# Patient Record
Sex: Female | Born: 1995 | Race: White | Hispanic: No | Marital: Single | Smoking: Never smoker
Health system: Southern US, Community
[De-identification: ages and names within clinical notes are randomized; demographics above are authoritative.]

## PROBLEM LIST (undated history)

## (undated) HISTORY — PX: TONSILLECTOMY: SUR1361

## (undated) HISTORY — PX: ADENOIDECTOMY: SUR15

---
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
## 2015-05-07 ENCOUNTER — Encounter (HOSPITAL_COMMUNITY): Payer: Self-pay | Admitting: Emergency Medicine

## 2015-05-07 ENCOUNTER — Emergency Department (HOSPITAL_COMMUNITY)
Admission: EM | Admit: 2015-05-07 | Discharge: 2015-05-07 | Disposition: A | Discharge status: Home / Self Care | Payer: Federal, State, Local not specified - PPO | Attending: Emergency Medicine | Admitting: Emergency Medicine

## 2015-05-07 ENCOUNTER — Emergency Department (HOSPITAL_COMMUNITY): Payer: Federal, State, Local not specified - PPO

## 2015-05-07 DIAGNOSIS — Z880 Allergy status to penicillin: Secondary | ICD-10-CM | POA: Insufficient documentation

## 2015-05-07 DIAGNOSIS — R079 Chest pain, unspecified: Secondary | ICD-10-CM | POA: Diagnosis not present

## 2015-05-07 DIAGNOSIS — Z79899 Other long term (current) drug therapy: Secondary | ICD-10-CM | POA: Diagnosis not present

## 2015-05-07 DIAGNOSIS — J029 Acute pharyngitis, unspecified: Secondary | ICD-10-CM | POA: Diagnosis present

## 2015-05-07 DIAGNOSIS — J069 Acute upper respiratory infection, unspecified: Secondary | ICD-10-CM | POA: Diagnosis not present

## 2015-05-07 LAB — URINALYSIS, ROUTINE W REFLEX MICROSCOPIC
Bilirubin Urine: NEGATIVE
GLUCOSE, UA: NEGATIVE mg/dL
Ketones, ur: 15 mg/dL — AB
LEUKOCYTES UA: NEGATIVE
Nitrite: NEGATIVE
PH: 8.5 — AB (ref 5.0–8.0)
Protein, ur: NEGATIVE mg/dL
SPECIFIC GRAVITY, URINE: 1.018 (ref 1.005–1.030)
Urobilinogen, UA: 0.2 mg/dL (ref 0.0–1.0)

## 2015-05-07 LAB — MONONUCLEOSIS SCREEN: MONO SCREEN: NEGATIVE

## 2015-05-07 LAB — CBC WITH DIFFERENTIAL/PLATELET
BASOS ABS: 0 10*3/uL (ref 0.0–0.1)
Basophils Relative: 0 %
Eosinophils Absolute: 0 10*3/uL (ref 0.0–0.7)
Eosinophils Relative: 0 %
HCT: 42.4 % (ref 36.0–46.0)
HEMOGLOBIN: 15 g/dL (ref 12.0–15.0)
LYMPHS ABS: 1.9 10*3/uL (ref 0.7–4.0)
LYMPHS PCT: 12 %
MCH: 31.6 pg (ref 26.0–34.0)
MCHC: 35.4 g/dL (ref 30.0–36.0)
MCV: 89.5 fL (ref 78.0–100.0)
Monocytes Absolute: 0.9 10*3/uL (ref 0.1–1.0)
Monocytes Relative: 6 %
NEUTROS ABS: 12.3 10*3/uL — AB (ref 1.7–7.7)
NEUTROS PCT: 82 %
Platelets: 215 10*3/uL (ref 150–400)
RBC: 4.74 MIL/uL (ref 3.87–5.11)
RDW: 11.8 % (ref 11.5–15.5)
WBC: 15.1 10*3/uL — AB (ref 4.0–10.5)

## 2015-05-07 LAB — BASIC METABOLIC PANEL
Anion gap: 9 (ref 5–15)
BUN: 11 mg/dL (ref 6–20)
CHLORIDE: 104 mmol/L (ref 101–111)
CO2: 23 mmol/L (ref 22–32)
CREATININE: 0.7 mg/dL (ref 0.44–1.00)
Calcium: 9.6 mg/dL (ref 8.9–10.3)
GFR calc Af Amer: >60 mL/min (ref >60)
GFR calc non Af Amer: >60 mL/min (ref >60)
Glucose, Bld: 109 mg/dL — ABNORMAL HIGH (ref 65–99)
Potassium: 3.5 mmol/L (ref 3.5–5.1)
SODIUM: 136 mmol/L (ref 135–145)

## 2015-05-07 LAB — URINE MICROSCOPIC-ADD ON

## 2015-05-07 LAB — PREGNANCY, URINE: Preg Test, Ur: NEGATIVE

## 2015-05-07 LAB — RAPID STREP SCREEN (MED CTR MEBANE ONLY): Streptococcus, Group A Screen (Direct): NEGATIVE

## 2015-05-07 MED ORDER — KETOROLAC TROMETHAMINE 30 MG/ML IJ SOLN
30.0000 mg | Freq: Once | INTRAMUSCULAR | Status: AC
  Administered 2015-05-07: 30 mg via INTRAVENOUS
  Filled 2015-05-07: qty 1

## 2015-05-07 MED ORDER — HYDROCODONE-HOMATROPINE 5-1.5 MG/5ML PO SYRP: 5.0000 mL | ORAL_SOLUTION | Freq: Four times a day (QID) | ORAL | Status: DC | PRN

## 2015-05-07 MED ORDER — MELOXICAM 15 MG PO TABS: 15.0000 mg | ORAL_TABLET | Freq: Every day | ORAL | Status: DC

## 2015-05-07 MED ORDER — ACETAMINOPHEN 500 MG PO TABS
1000.0000 mg | ORAL_TABLET | Freq: Once | ORAL | Status: AC
  Administered 2015-05-07: 1000 mg via ORAL
  Filled 2015-05-07: qty 2

## 2015-05-07 MED ORDER — SODIUM CHLORIDE 0.9 % IV BOLUS (SEPSIS)
1000.0000 mL | Freq: Once | INTRAVENOUS | Status: AC
  Administered 2015-05-07: 1000 mL via INTRAVENOUS

## 2015-05-07 MED ORDER — DIPHENHYDRAMINE HCL 50 MG/ML IJ SOLN
25.0000 mg | Freq: Once | INTRAMUSCULAR | Status: AC
  Administered 2015-05-07: 25 mg via INTRAVENOUS
  Filled 2015-05-07: qty 1

## 2015-05-07 NOTE — ED Provider Notes (Signed)
CSN: [id number]     Arrival date & time 05/07/15  0025 History   First MD Initiated Contact with Patient 05/07/15 0151     Chief Complaint  Patient presents with  . URI     (Consider location/radiation/quality/duration/timing/severity/associated sxs/prior Treatment) Patient is a 19 y.o. female presenting with URI. The history is provided by the patient. No language interpreter was used.  URI Presenting symptoms: congestion, cough and sore throat   Congestion:    Location:  Nasal   Interferes with sleep: yes     Interferes with eating/drinking: yes   Cough:    Cough characteristics:  Hacking   Sputum characteristics:  Nondescript   Severity:  Moderate   Onset quality:  Sudden   Duration:  1 day   Timing:  Constant   Progression:  Worsening   Chronicity:  New Sore throat:    Severity:  Severe   Onset quality:  Gradual   Duration:  1 day   Timing:  Constant   Progression:  Worsening Severity:  Moderate Onset quality:  Gradual Duration:  1 day Timing:  Constant Progression:  Unchanged Chronicity:  New Relieved by:  Nothing Worsened by:  Nothing tried Ineffective treatments:  OTC medications Associated symptoms: headaches   Risk factors: sick contacts   Risk factors: not elderly, no chronic cardiac disease, no chronic kidney disease, no chronic respiratory disease, no diabetes mellitus, no immunosuppression, no recent illness and no recent travel     History reviewed. No pertinent past medical history. Past Surgical History  Procedure Laterality Date  . Tonsillectomy    . Adenoidectomy     History reviewed. No pertinent family history. Social History  Substance Use Topics  . Smoking status: Never Smoker   . Smokeless tobacco: None  . Alcohol Use: No   OB History    No data available     Review of Systems  HENT: Positive for congestion and sore throat.   Respiratory: Positive for cough and chest tightness.   Cardiovascular: Positive for chest pain.   Neurological: Positive for headaches.  All other systems reviewed and are negative.     Allergies  Penicillins  Home Medications   Prior to Admission medications   Medication Sig Start Date End Date Taking? Authorizing Provider  ibuprofen (ADVIL,MOTRIN) 200 MG tablet Take 200 mg by mouth every 6 (six) hours as needed.   Yes Historical Provider, MD  levonorgestrel (MIRENA) 20 MCG/24HR IUD 1 each by Intrauterine route once.   Yes Historical Provider, MD  Phenyleph-Diphenhyd-DM-APAP [facility] SEVERE COLD & COUGH) PACKET MISC Take 1 each by mouth daily.   Yes Historical Provider, MD  HYDROcodone-acetaminophen (NORCO/VICODIN) 5-325 MG per tablet Take 1 tablet by mouth every 4 (four) hours as needed for moderate pain or severe pain. Patient not taking: Reported on 05/07/2015 08/07/14   Harle Battiest, NP  naproxen (NAPROSYN) 500 MG tablet Take 1 tablet (500 mg total) by mouth 2 (two) times daily. Patient not taking: Reported on 05/07/2015 08/07/14   Harle Battiest, NP   BP 106/64 mmHg  Pulse 109  Temp(Src) 101.4 F (38.6 C) (Oral)  Resp 22  SpO2 97% Physical Exam  Constitutional: She is oriented to person, place, and time. She appears well-developed and well-nourished. No distress.  HENT:  Head: Normocephalic and atraumatic.  Mouth/Throat: Oropharynx is clear and moist.  Eyes: Conjunctivae and EOM are normal. Pupils are equal, round, and reactive to light.  Neck: Normal range of motion.  Cardiovascular: Normal rate and regular rhythm.  Exam reveals no gallop and no friction rub.   No murmur heard. Pulmonary/Chest: Effort normal and breath sounds normal. She has no wheezes. She has no rales. She exhibits no tenderness.  Abdominal: Soft. She exhibits no distension. There is no tenderness. There is no rebound.  Musculoskeletal: Normal range of motion.  Neurological: She is alert and oriented to person, place, and time. Coordination normal.  Speech is goal-oriented. Moves limbs  without ataxia.   Skin: Skin is warm and dry.  Psychiatric: She has a normal mood and affect. Her behavior is normal.  Nursing note and vitals reviewed.   ED Course  Procedures (including critical care time) Labs Review Labs Reviewed  CBC WITH DIFFERENTIAL/PLATELET - Abnormal; Notable for the following:    WBC 15.1 (*)    Neutro Abs 12.3 (*)    All other components within normal limits  URINALYSIS, ROUTINE W REFLEX MICROSCOPIC (NOT AT [facility]) - Abnormal; Notable for the following:    pH 8.5 (*)    Hgb urine dipstick SMALL (*)    Ketones, ur 15 (*)    All other components within normal limits  URINE MICROSCOPIC-ADD ON - Abnormal; Notable for the following:    Bacteria, UA FEW (*)    All other components within normal limits  RAPID STREP SCREEN (NOT AT [facility])  PREGNANCY, URINE  BASIC METABOLIC PANEL  MONONUCLEOSIS SCREEN    Imaging Review Dg Chest 2 View  05/07/2015  CLINICAL DATA:  Sinus pressure, swollen neck glands, chest tightness for 1 day. EXAM: CHEST  2 VIEW COMPARISON:  None. FINDINGS: Cardiomediastinal silhouette is normal. The lungs are clear without pleural effusions or focal consolidations. Trachea projects midline and there is no pneumothorax. Soft tissue planes and included osseous structures are non-suspicious. IMPRESSION: Normal chest. Electronically Signed   By: Awilda Metroourtnay  Bloomer M.D.   On: 05/07/2015 02:28   I have personally reviewed and evaluated these images and lab results as part of my medical decision-making.   EKG Interpretation   Date/Time:  Tuesday May 07 2015 02:37:07 EST Ventricular Rate:  103 PR Interval:  134 QRS Duration: 93 QT Interval:  331 QTC Calculation: 433 R Axis:   67 Text Interpretation:  Sinus tachycardia Nonspecific T abnormalities,  anterior leads No previous tracing Confirmed by POLLINA  MD, CHRISTOPHER  [phone]) on 05/07/2015 2:40:36 AM      MDM   Final diagnoses:  URI (upper respiratory infection)    4:32  AM Patient's imaging and labs unremarkable for acute changes. Vitals stable and patient afebrile. Patient reports improvement of symptoms after IV fluids and toradol. Patient will be discharged with symptomatic treatment.     Emilia BeckKaitlyn Denelda Akerley, PA-C 05/07/15 [phone]  Gilda Creasehristopher J Pollina, MD 05/07/15 [phone]

## 2015-05-07 NOTE — ED Notes (Signed)
Patient requested update from provider. PA notified.

## 2015-05-07 NOTE — ED Notes (Signed)
Bed: WA05 Expected date:  Expected time:  Means of arrival:  Comments: 

## 2015-05-07 NOTE — Discharge Instructions (Signed)
Take hycodan as needed for cough. Take mobic as needed for pain. Refer to attached documents for more information.

## 2015-05-07 NOTE — ED Notes (Signed)
PA at bedside.

## 2015-05-07 NOTE — ED Notes (Signed)
Pt states she has sinus pressure, fever, swollen glands in her neck, and this evening developed chest tightness  Pt states her sxs started yesterday  Pt states she took ibuprofen 800mg  about 2 hrs ago and theraflu about 3 hrs ago

## 2015-05-09 LAB — CULTURE, GROUP A STREP: Strep A Culture: NEGATIVE

## 2016-03-05 ENCOUNTER — Encounter: Payer: Self-pay | Admitting: Women's Health

## 2016-03-05 ENCOUNTER — Ambulatory Visit (INDEPENDENT_AMBULATORY_CARE_PROVIDER_SITE_OTHER): Payer: Federal, State, Local not specified - PPO | Admitting: Women's Health

## 2016-03-05 VITALS — BP 117/79 | Ht 60.0 in | Wt 229.2 lb

## 2016-03-05 DIAGNOSIS — Z01419 Encounter for gynecological examination (general) (routine) without abnormal findings: Secondary | ICD-10-CM | POA: Diagnosis not present

## 2016-03-05 DIAGNOSIS — N926 Irregular menstruation, unspecified: Secondary | ICD-10-CM

## 2016-03-05 NOTE — Progress Notes (Signed)
Michelle Roy 01-31-1996 [id number]   History:    Presents for annual exam.  Rare bleeding Mirena IUD placed December 2015 in [place]. First partner both. Irregular cycles in the past. Received gardasil. Reports paternal aunt died of cervical cancer. Reports recent normal CBC, TSH at primary care.  Past medical history, past surgical history, family history and social history were all reviewed and documented in the EPIC chart. 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 Went to [facility] [employer]. 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 Parents healthy.  ROS:  A ROS was performed and pertinent positives and negatives are included.  Exam:  Vitals:   03/05/16 1427  BP: 117/79  Weight: 229 lb 3.2 oz (104 kg)  Height: 5' (1.524 m)   Body mass index is 44.76 kg/m.   General appearance:  Normal Thyroid:  Symmetrical, normal in size, without palpable masses or nodularity. Respiratory  Auscultation:  Clear without wheezing or rhonchi Cardiovascular  Auscultation:  Regular rate, without rubs, murmurs or gallops  Edema/varicosities:  Not grossly evident Abdominal  Soft,nontender, without masses, guarding or rebound.  Liver/spleen:  No organomegaly noted  Hernia:  None appreciated  Skin  Inspection:  Grossly normal   Breasts: Examined lying and sitting.     Right: Without masses, retractions, discharge or axillary adenopathy.     Left: Without masses, retractions, discharge or axillary adenopathy. Gentitourinary   Inguinal/mons:  Normal without inguinal adenopathy  External genitalia:  Normal  BUS/Urethra/Skene's glands:  Normal  Vagina:  Normal  Cervix:  Normal IUD strings visible  Uterus:   normal in size, shape and contour.  Midline and mobile  Adnexa/parametria:     Rt: Without masses or tenderness.   Lt: Without masses or tenderness.  Anus and perineum: Normal    Assessment/Plan:  20 y.o. S [place] G0 for annual exam with complaint of weight gain.  05/2014 Mirena IUD placed in  [place] Obesity  Plan: Aware Mirena IUD is good for 5 years. Schedule ultrasound. SBE's, exercise, calcium rich diet, MVI daily encouraged. Reviewed importance of increasing exercise and decreasing calories especially carbs for weight loss. Campus safety reviewed.  Prolactin, UA, Pap.   Harrington ChallengerYOUNG,NANCY J [facility] CenterWHNP, 4:43 PM 03/05/2016

## 2016-03-05 NOTE — Patient Instructions (Signed)
Health Maintenance, Female Adopting a healthy lifestyle and getting preventive care can go a long way to promote health and wellness. Talk with your health care provider about what schedule of regular examinations is right for you. This is a good chance for you to check in with your provider about disease prevention and staying healthy. In between checkups, there are plenty of things you can do on your own. Experts have done a lot of research about which lifestyle changes and preventive measures are most likely to keep you healthy. Ask your health care provider for more information. WEIGHT AND DIET  Eat a healthy diet  Be sure to include plenty of vegetables, fruits, low-fat dairy products, and lean protein.  Do not eat a lot of foods high in solid fats, added sugars, or salt.  Get regular exercise. This is one of the most important things you can do for your health.  Most adults should exercise for at least 150 minutes each week. The exercise should increase your heart rate and make you sweat (moderate-intensity exercise).  Most adults should also do strengthening exercises at least twice a week. This is in addition to the moderate-intensity exercise.  Maintain a healthy weight  Body mass index (BMI) is a measurement that can be used to identify possible weight problems. It estimates body fat based on height and weight. Your health care provider can help determine your BMI and help you achieve or maintain a healthy weight.  For females 20 years of age and older:   A BMI below 18.5 is considered underweight.  A BMI of 18.5 to 24.9 is normal.  A BMI of 25 to 29.9 is considered overweight.  A BMI of 30 and above is considered obese.  Watch levels of cholesterol and blood lipids  You should start having your blood tested for lipids and cholesterol at 20 years of age, then have this test every 5 years.  You may need to have your cholesterol levels checked more often if:  Your lipid  or cholesterol levels are high.  You are older than 20 years of age.  You are at high risk for heart disease.  CANCER SCREENING   Lung Cancer  Lung cancer screening is recommended for adults 55-80 years old who are at high risk for lung cancer because of a history of smoking.  A yearly low-dose CT scan of the lungs is recommended for people who:  Currently smoke.  Have quit within the past 15 years.  Have at least a 30-pack-year history of smoking. A pack year is smoking an average of one pack of cigarettes a day for 1 year.  Yearly screening should continue until it has been 15 years since you quit.  Yearly screening should stop if you develop a health problem that would prevent you from having lung cancer treatment.  Breast Cancer  Practice breast self-awareness. This means understanding how your breasts normally appear and feel.  It also means doing regular breast self-exams. Let your health care provider know about any changes, no matter how small.  If you are in your 20s or 30s, you should have a clinical breast exam (CBE) by a health care provider every 1-3 years as part of a regular health exam.  If you are 40 or older, have a CBE every year. Also consider having a breast X-ray (mammogram) every year.  If you have a family history of breast cancer, talk to your health care provider about genetic screening.  If you   are at high risk for breast cancer, talk to your health care provider about having an MRI and a mammogram every year.  Breast cancer gene (BRCA) assessment is recommended for women who have family members with BRCA-related cancers. BRCA-related cancers include:  Breast.  Ovarian.  Tubal.  Peritoneal cancers.  Results of the assessment will determine the need for genetic counseling and BRCA1 and BRCA2 testing. Cervical Cancer Your health care provider may recommend that you be screened regularly for cancer of the pelvic organs (ovaries, uterus, and  vagina). This screening involves a pelvic examination, including checking for microscopic changes to the surface of your cervix (Pap test). You may be encouraged to have this screening done every 3 years, beginning at age 21.  For women ages 30-65, health care providers may recommend pelvic exams and Pap testing every 3 years, or they may recommend the Pap and pelvic exam, combined with testing for human papilloma virus (HPV), every 5 years. Some types of HPV increase your risk of cervical cancer. Testing for HPV may also be done on women of any age with unclear Pap test results.  Other health care providers may not recommend any screening for nonpregnant women who are considered low risk for pelvic cancer and who do not have symptoms. Ask your health care provider if a screening pelvic exam is right for you.  If you have had past treatment for cervical cancer or a condition that could lead to cancer, you need Pap tests and screening for cancer for at least 20 years after your treatment. If Pap tests have been discontinued, your risk factors (such as having a new sexual partner) need to be reassessed to determine if screening should resume. Some women have medical problems that increase the chance of getting cervical cancer. In these cases, your health care provider may recommend more frequent screening and Pap tests. Colorectal Cancer  This type of cancer can be detected and often prevented.  Routine colorectal cancer screening usually begins at 20 years of age and continues through 20 years of age.  Your health care provider may recommend screening at an earlier age if you have risk factors for colon cancer.  Your health care provider may also recommend using home test kits to check for hidden blood in the stool.  A small camera at the end of a tube can be used to examine your colon directly (sigmoidoscopy or colonoscopy). This is done to check for the earliest forms of colorectal  cancer.  Routine screening usually begins at age 50.  Direct examination of the colon should be repeated every 5-10 years through 20 years of age. However, you may need to be screened more often if early forms of precancerous polyps or small growths are found. Skin Cancer  Check your skin from head to toe regularly.  Tell your health care provider about any new moles or changes in moles, especially if there is a change in a mole's shape or color.  Also tell your health care provider if you have a mole that is larger than the size of a pencil eraser.  Always use sunscreen. Apply sunscreen liberally and repeatedly throughout the day.  Protect yourself by wearing long sleeves, pants, a wide-brimmed hat, and sunglasses whenever you are outside. HEART DISEASE, DIABETES, AND HIGH BLOOD PRESSURE   High blood pressure causes heart disease and increases the risk of stroke. High blood pressure is more likely to develop in:  People who have blood pressure in the high end   of the normal range (130-139/85-89 mm Hg).  People who are overweight or obese.  People who are African American.  If you are 38-23 years of age, have your blood pressure checked every 3-5 years. If you are 61 years of age or older, have your blood pressure checked every year. You should have your blood pressure measured twice--once when you are at a hospital or clinic, and once when you are not at a hospital or clinic. Record the average of the two measurements. To check your blood pressure when you are not at a hospital or clinic, you can use:  An automated blood pressure machine at a pharmacy.  A home blood pressure monitor.  If you are between 45 years and 39 years old, ask your health care provider if you should take aspirin to prevent strokes.  Have regular diabetes screenings. This involves taking a blood sample to check your fasting blood sugar level.  If you are at a normal weight and have a low risk for diabetes,  have this test once every three years after 20 years of age.  If you are overweight and have a high risk for diabetes, consider being tested at a younger age or more often. PREVENTING INFECTION  Hepatitis B  If you have a higher risk for hepatitis B, you should be screened for this virus. You are considered at high risk for hepatitis B if:  You were born in a country where hepatitis B is common. Ask your health care provider which countries are considered high risk.  Your parents were born in a high-risk country, and you have not been immunized against hepatitis B (hepatitis B vaccine).  You have HIV or AIDS.  You use needles to inject street drugs.  You live with someone who has hepatitis B.  You have had sex with someone who has hepatitis B.  You get hemodialysis treatment.  You take certain medicines for conditions, including cancer, organ transplantation, and autoimmune conditions. Hepatitis C  Blood testing is recommended for:  Everyone born from [birth year] through 1965.  Anyone with known risk factors for hepatitis C. Sexually transmitted infections (STIs)  You should be screened for sexually transmitted infections (STIs) including gonorrhea and chlamydia if:  You are sexually active and are younger than 20 years of age.  You are older than 20 years of age and your health care provider tells you that you are at risk for this type of infection.  Your sexual activity has changed since you were last screened and you are at an increased risk for chlamydia or gonorrhea. Ask your health care provider if you are at risk.  If you do not have HIV, but are at risk, it may be recommended that you take a prescription medicine daily to prevent HIV infection. This is called pre-exposure prophylaxis (PrEP). You are considered at risk if:  You are sexually active and do not regularly use condoms or know the HIV status of your partner(s).  You take drugs by injection.  You are sexually  active with a partner who has HIV. Talk with your health care provider about whether you are at high risk of being infected with HIV. If you choose to begin PrEP, you should first be tested for HIV. You should then be tested every 3 months for as long as you are taking PrEP.  PREGNANCY   If you are premenopausal and you may become pregnant, ask your health care provider about preconception counseling.  If you may  become pregnant, take 400 to 800 micrograms (mcg) of folic acid every day.  If you want to prevent pregnancy, talk to your health care provider about birth control (contraception). OSTEOPOROSIS AND MENOPAUSE   Osteoporosis is a disease in which the bones lose minerals and strength with aging. This can result in serious bone fractures. Your risk for osteoporosis can be identified using a bone density scan.  If you are 42 years of age or older, or if you are at risk for osteoporosis and fractures, ask your health care provider if you should be screened.  Ask your health care provider whether you should take a calcium or vitamin D supplement to lower your risk for osteoporosis.  Menopause may have certain physical symptoms and risks.  Hormone replacement therapy may reduce some of these symptoms and risks. Talk to your health care provider about whether hormone replacement therapy is right for you.  HOME CARE INSTRUCTIONS   Schedule regular health, dental, and eye exams.  Stay current with your immunizations.   Do not use any tobacco products including cigarettes, chewing tobacco, or electronic cigarettes.  If you are pregnant, do not drink alcohol.  If you are breastfeeding, limit how much and how often you drink alcohol.  Limit alcohol intake to no more than 1 drink per day for nonpregnant women. One drink equals 12 ounces of beer, 5 ounces of wine, or 1 ounces of hard liquor.  Do not use street drugs.  Do not share needles.  Ask your health care provider for help if  you need support or information about quitting drugs.  Tell your health care provider if you often feel depressed.  Tell your health care provider if you have ever been abused or do not feel safe at home.   This information is not intended to replace advice given to you by your health care provider. Make sure you discuss any questions you have with your health care provider.   Document Released: 12/29/2010 Document Revised: 07/06/2014 Document Reviewed: 05/17/2013 Elsevier Interactive Patient Education 2016 [place] Carbohydrate Counting for Diabetes Mellitus Carbohydrate counting is a method for keeping track of the amount of carbohydrates you eat. Eating carbohydrates naturally increases the level of sugar (glucose) in your blood, so it is important for you to know the amount that is okay for you to have in every meal. Carbohydrate counting helps keep the level of glucose in your blood within normal limits. The amount of carbohydrates allowed is different for every person. A dietitian can help you calculate the amount that is right for you. Once you know the amount of carbohydrates you can have, you can count the carbohydrates in the foods you want to eat. Carbohydrates are found in the following foods:  Grains, such as breads and cereals.  Dried beans and soy products.  Starchy vegetables, such as potatoes, peas, and corn.  Fruit and fruit juices.  Milk and yogurt.  Sweets and snack foods, such as cake, cookies, candy, chips, soft drinks, and fruit drinks. CARBOHYDRATE COUNTING There are two ways to count the carbohydrates in your food. You can use either of the methods or a combination of both. Reading the "Nutrition Facts" on [place] The "Nutrition Facts" is an area that is included on the labels of almost all packaged food and beverages in the [place]. It includes the serving size of that food or beverage and information about the nutrients in each serving of  the food, including the grams (g)  of carbohydrate per serving.  Decide the number of servings of this food or beverage that you will be able to eat or drink. Multiply that number of servings by the number of grams of carbohydrate that is listed on the label for that serving. The total will be the amount of carbohydrates you will be having when you eat or drink this food or beverage. Learning Standard Serving Sizes of Food When you eat food that is not packaged or does not include "Nutrition Facts" on the label, you need to measure the servings in order to count the amount of carbohydrates.A serving of most carbohydrate-rich foods contains about 15 g of carbohydrates. The following list includes serving sizes of carbohydrate-rich foods that provide 15 g ofcarbohydrate per serving:   1 slice of bread (1 oz) or 1 six-inch tortilla.    of a hamburger bun or English muffin.  4-6 crackers.   cup unsweetened dry cereal.    cup hot cereal.   cup rice or pasta.    cup mashed potatoes or  of a large baked potato.  1 cup fresh fruit or one small piece of fruit.    cup canned or frozen fruit or fruit juice.  1 cup milk.   cup plain fat-free yogurt or yogurt sweetened with artificial sweeteners.   cup cooked dried beans or starchy vegetable, such as peas, corn, or potatoes.  Decide the number of standard-size servings that you will eat. Multiply that number of servings by 15 (the grams of carbohydrates in that serving). For example, if you eat 2 cups of strawberries, you will have eaten 2 servings and 30 g of carbohydrates (2 servings x 15 g = 30 g). For foods such as soups and casseroles, in which more than one food is mixed in, you will need to count the carbohydrates in each food that is included. EXAMPLE OF CARBOHYDRATE COUNTING Sample Dinner  3 oz chicken breast.   cup of brown rice.   cup of corn.  1 cup milk.   1 cup strawberries with sugar-free whipped topping.   Carbohydrate Calculation Step 1: Identify the foods that contain carbohydrates:   Rice.   Corn.   Milk.   Strawberries. Step 2:Calculate the number of servings eaten of each:   2 servings of rice.   1 serving of corn.   1 serving of milk.   1 serving of strawberries. Step 3: Multiply each of those number of servings by 15 g:   2 servings of rice x 15 g = 30 g.   1 serving of corn x 15 g = 15 g.   1 serving of milk x 15 g = 15 g.   1 serving of strawberries x 15 g = 15 g. Step 4: Add together all of the amounts to find the total grams of carbohydrates eaten: 30 g + 15 g + 15 g + 15 g = 75 g.   This information is not intended to replace advice given to you by your health care provider. Make sure you discuss any questions you have with your health care provider.   Document Released: 06/15/2005 Document Revised: 07/06/2014 Document Reviewed: 05/12/2013 Elsevier Interactive Patient Education [employer].

## 2016-03-06 LAB — PAP IG W/ RFLX HPV ASCU

## 2016-03-06 LAB — PROLACTIN: PROLACTIN: 7.2 ng/mL

## 2016-03-11 ENCOUNTER — Ambulatory Visit (INDEPENDENT_AMBULATORY_CARE_PROVIDER_SITE_OTHER): Payer: Federal, State, Local not specified - PPO | Admitting: Women's Health

## 2016-03-11 ENCOUNTER — Encounter: Payer: Self-pay | Admitting: Women's Health

## 2016-03-11 ENCOUNTER — Ambulatory Visit (INDEPENDENT_AMBULATORY_CARE_PROVIDER_SITE_OTHER): Payer: Federal, State, Local not specified - PPO

## 2016-03-11 VITALS — BP 110/78 | Ht 60.0 in | Wt 229.0 lb

## 2016-03-11 DIAGNOSIS — N926 Irregular menstruation, unspecified: Secondary | ICD-10-CM | POA: Diagnosis not present

## 2016-03-11 NOTE — Progress Notes (Signed)
Presents for ultrasound. History of irregular cycles prior to Mirena IUD placement (05/2014) and weight gain. No hirsutism, acne, acanthosis nigricans.Two aunts with PCOS, reports paternal aunt died of cervical cancer. 03/05/2016 Pap and Prolactin normal. No complaints today.  Exam: Appears well/obese  Ultrasound: T/V anteverted uterus with tri layered endometrium, measured at 4.3 mm, and IUD seen in normal position in uterus. Right and left ovaries normal. Negative cul-de-sac. No apparent mass seen in right/left adnexal.  Normal GYN Ultrasound Weight gain  Plan: Reviewed U/S results and signs and symptoms of PCOS. Aware Mirena IUD good for 5 years. Discuss further workup for weight gain with PCP. Continue to decrease calories, carbs and continue exercise. Plan to see at next annual visit and contact our office if any problems before then.

## 2016-06-16 ENCOUNTER — Other Ambulatory Visit: Payer: Self-pay | Admitting: Physician Assistant

## 2016-06-16 DIAGNOSIS — M542 Cervicalgia: Secondary | ICD-10-CM

## 2016-06-18 ENCOUNTER — Ambulatory Visit
Admission: RE | Admit: 2016-06-18 | Discharge: 2016-06-18 | Disposition: A | Discharge status: Home / Self Care | Payer: Federal, State, Local not specified - PPO | Source: Ambulatory Visit | Attending: Physician Assistant | Admitting: Physician Assistant

## 2016-06-18 DIAGNOSIS — M542 Cervicalgia: Secondary | ICD-10-CM

## 2016-07-04 ENCOUNTER — Encounter (HOSPITAL_COMMUNITY): Payer: Self-pay | Admitting: Emergency Medicine

## 2016-07-04 ENCOUNTER — Emergency Department (HOSPITAL_COMMUNITY)
Admission: EM | Admit: 2016-07-04 | Discharge: 2016-07-04 | Disposition: A | Discharge status: Home / Self Care | Payer: Federal, State, Local not specified - PPO | Attending: Emergency Medicine | Admitting: Emergency Medicine

## 2016-07-04 DIAGNOSIS — M542 Cervicalgia: Secondary | ICD-10-CM

## 2016-07-04 DIAGNOSIS — Z79899 Other long term (current) drug therapy: Secondary | ICD-10-CM | POA: Insufficient documentation

## 2016-07-04 MED ORDER — METHOCARBAMOL 500 MG PO TABS: 500.0000 mg | ORAL_TABLET | Freq: Every evening | ORAL | 0 refills | Status: AC | PRN

## 2016-07-04 MED ORDER — TRAMADOL HCL 50 MG PO TABS: 50.0000 mg | ORAL_TABLET | Freq: Four times a day (QID) | ORAL | 0 refills | Status: AC | PRN

## 2016-07-04 NOTE — ED Triage Notes (Addendum)
Pt reports worsening neck pain over past 6 months; pt states US negative and has follow up appointment with specialist in 2 weeks. Pt here for pain management.

## 2016-07-04 NOTE — Discharge Instructions (Signed)
Continue taking Meloxicam Robaxin is a muscle relaxer. Take this medicine to help with stiffness or to help you sleep Tramadol - take when pain is severe. You can take this while taking the other medicines. Do not drive or drink alcohol with this medicine.

## 2016-07-04 NOTE — ED Provider Notes (Signed)
[facility]-EMERGENCY DEPT Provider Note   CSN: [id number] Arrival date & time: 07/04/16  1213  By signing my name below, I, Vista Minkobert Ross, attest that this documentation has been prepared under the direction and in the presence of YahooKelly Bergen Magner PA-C.  Electronically Signed: Vista Minkobert Ross, ED Scribe. 07/04/16. 12:41 PM.   History   Chief Complaint Chief Complaint  Patient presents with  . Neck Pain   HPI HPI Comments: Michelle GoreLogan Roy is a 21 y.o. female, with no pertinent Hx, who presents to the Emergency Department complaining of gradually worsening pain and swelling to her posterior neck over the past 6 months. Pt was seen on 06/18/16 by her PCP and had a soft tissue US of the area to evaluate the progressive swelling over the past 6 months that showed no soft tissue mass or cyst was seen. She has also had a work up to r/o Cushing's disease which was negative. She has a follow up with a general surgeon this month on 07/17/16 but states that she can't wait until then due to the pain. She reports exacerbation of pain when turning her head, right sided pain worse than left. She also reports an intermittent shooting pain when moving her neck. She has taken Meloxicam and applied heating pads with no relief of symptoms. Pt has not tried muscle relaxer's. No fever.  The history is provided by the patient. No language interpreter was used.    History reviewed. No pertinent past medical history.  There are no active problems to display for this patient.   Past Surgical History:  Procedure Laterality Date  . ADENOIDECTOMY    . TONSILLECTOMY      OB History    Gravida Para Term Preterm AB Living   0 0 0 0 0 0   SAB TAB Ectopic Multiple Live Births   0 0 0 0 0       Home Medications    Prior to Admission medications   Medication Sig Start Date End Date Taking? Authorizing Provider  ibuprofen (ADVIL,MOTRIN) 200 MG tablet Take 200 mg by mouth every 6 (six) hours as needed.    Historical Provider, MD   levonorgestrel (MIRENA) 20 MCG/24HR IUD 1 each by Intrauterine route once.    Historical Provider, MD    Family History Family History  Problem Relation Age of Onset  . Polycystic ovary syndrome Paternal Aunt     Social History Social History  Substance Use Topics  . Smoking status: Never Smoker  . Smokeless tobacco: Never Used  . Alcohol use No    Allergies   Penicillins   Review of Systems Review of Systems  Constitutional: Negative for fever.  Musculoskeletal: Positive for neck pain and neck stiffness.     Physical Exam Updated Vital Signs BP 125/75   Pulse 112   Temp 97.6 F (36.4 C)   Resp 16   SpO2 100%   Physical Exam  Constitutional: She is oriented to person, place, and time. She appears well-developed and well-nourished. No distress.  Obese, NAD  HENT:  Head: Normocephalic and atraumatic.  Neck: Normal range of motion.  Enlarged dorsicocervical fat pad with tenderness. FROM of neck.  Pulmonary/Chest: Effort normal.  Neurological: She is alert and oriented to person, place, and time.  Sitting in chair in NAD. GCS 15. Speaks in a clear voice. Cranial nerves II through XII grossly intact. 5/5 strength in all extremities. Sensation fully intact. Ambulatory   Skin: Skin is warm and dry. She is not diaphoretic.  Psychiatric: She has a normal mood and affect. Judgment normal.  Nursing note and vitals reviewed.    ED Treatments / Results  DIAGNOSTIC STUDIES: Oxygen Saturation is 100% on RA, normal by my interpretation.  COORDINATION OF CARE: 12:38 PM-Discussed treatment plan with pt at bedside and pt agreed to plan.   Labs (all labs ordered are listed, but only abnormal results are displayed) Labs Reviewed - No data to display  EKG  EKG Interpretation None       Radiology No results found.  Procedures Procedures (including critical care time)  Medications Ordered in ED Medications - No data to display   Initial Impression /  Assessment and Plan / ED Course  I have reviewed the triage vital signs and the nursing notes.  Pertinent labs & imaging results that were available during my care of the patient were reviewed by me and considered in my medical decision making (see chart for details).  Clinical Course    21 year old female with tender dorsicocervical fat pad. Vitals are normal. Recent US was normal. Pt has appt with surgery in several weeks coming up. Pt asking for CT scan however I discussed with her that this is not currently indicated and she should follow up with surgeon. Pain control offered. Patient is NAD, non-toxic, with stable VS. Opportunity for questions provided and all questions answered. Return precautions given.   Final Clinical Impressions(s) / ED Diagnoses   Final diagnoses:  Neck pain    New Prescriptions Discharge Medication List as of 07/04/2016 12:40 PM    START taking these medications   Details  methocarbamol (ROBAXIN) 500 MG tablet Take 1 tablet (500 mg total) by mouth at bedtime and may repeat dose one time if needed., Starting Sat 07/04/2016, Print    traMADol (ULTRAM) 50 MG tablet Take 1 tablet (50 mg total) by mouth every 6 (six) hours as needed., Starting Sat 07/04/2016, Print       I personally performed the services described in this documentation, which was scribed in my presence. The recorded information has been reviewed and is accurate.    Bethel Born, PA-C 07/09/16 1618    Lorre Nick, MD 07/10/16 1037

## 2016-11-18 ENCOUNTER — Encounter: Payer: Self-pay | Admitting: Internal Medicine

## 2017-05-11 ENCOUNTER — Emergency Department (HOSPITAL_COMMUNITY)
Admission: EM | Admit: 2017-05-11 | Discharge: 2017-05-11 | Disposition: A | Discharge status: Home / Self Care | Payer: Federal, State, Local not specified - PPO | Attending: Emergency Medicine | Admitting: Emergency Medicine

## 2017-05-11 ENCOUNTER — Encounter (HOSPITAL_COMMUNITY): Payer: Self-pay | Admitting: Emergency Medicine

## 2017-05-11 ENCOUNTER — Emergency Department (HOSPITAL_COMMUNITY): Payer: Federal, State, Local not specified - PPO

## 2017-05-11 DIAGNOSIS — Z79899 Other long term (current) drug therapy: Secondary | ICD-10-CM | POA: Insufficient documentation

## 2017-05-11 DIAGNOSIS — R091 Pleurisy: Secondary | ICD-10-CM | POA: Diagnosis not present

## 2017-05-11 DIAGNOSIS — R000 Tachycardia, unspecified: Secondary | ICD-10-CM | POA: Diagnosis not present

## 2017-05-11 DIAGNOSIS — R072 Precordial pain: Secondary | ICD-10-CM | POA: Diagnosis present

## 2017-05-11 LAB — CBC
HCT: 41.6 % (ref 36.0–46.0)
HEMOGLOBIN: 14.8 g/dL (ref 12.0–15.0)
MCH: 32.3 pg (ref 26.0–34.0)
MCHC: 35.6 g/dL (ref 30.0–36.0)
MCV: 90.8 fL (ref 78.0–100.0)
Platelets: 244 10*3/uL (ref 150–400)
RBC: 4.58 MIL/uL (ref 3.87–5.11)
RDW: 12.2 % (ref 11.5–15.5)
WBC: 7.5 10*3/uL (ref 4.0–10.5)

## 2017-05-11 LAB — BASIC METABOLIC PANEL
ANION GAP: 6 (ref 5–15)
BUN: 13 mg/dL (ref 6–20)
CHLORIDE: 107 mmol/L (ref 101–111)
CO2: 27 mmol/L (ref 22–32)
Calcium: 9.2 mg/dL (ref 8.9–10.3)
Creatinine, Ser: 0.88 mg/dL (ref 0.44–1.00)
GFR calc non Af Amer: >60 mL/min (ref >60)
Glucose, Bld: 99 mg/dL (ref 65–99)
POTASSIUM: 4 mmol/L (ref 3.5–5.1)
SODIUM: 140 mmol/L (ref 135–145)

## 2017-05-11 LAB — I-STAT BETA HCG BLOOD, ED (MC, WL, AP ONLY)

## 2017-05-11 LAB — I-STAT TROPONIN, ED: Troponin i, poc: 0 ng/mL (ref 0.00–0.08)

## 2017-05-11 LAB — D-DIMER, QUANTITATIVE (NOT AT ARMC): D DIMER QUANT: 2.77 ug{FEU}/mL — AB (ref 0.00–0.50)

## 2017-05-11 MED ORDER — MORPHINE SULFATE (PF) 4 MG/ML IV SOLN
4.0000 mg | Freq: Once | INTRAVENOUS | Status: AC
  Administered 2017-05-11: 4 mg via INTRAVENOUS
  Filled 2017-05-11: qty 1

## 2017-05-11 MED ORDER — IOPAMIDOL (ISOVUE-370) INJECTION 76%
INTRAVENOUS | Status: AC
  Filled 2017-05-11: qty 100

## 2017-05-11 MED ORDER — NAPROXEN 500 MG PO TABS: 500.0000 mg | ORAL_TABLET | Freq: Two times a day (BID) | ORAL | 0 refills | Status: AC

## 2017-05-11 MED ORDER — IOPAMIDOL (ISOVUE-370) INJECTION 76%
100.0000 mL | Freq: Once | INTRAVENOUS | Status: AC | PRN
  Administered 2017-05-11: 100 mL via INTRAVENOUS

## 2017-05-11 NOTE — Discharge Instructions (Signed)
Follow up with your doctor next week °

## 2017-05-11 NOTE — ED Notes (Signed)
Patient transported to CT 

## 2017-05-11 NOTE — ED Notes (Signed)
Patient return from CT

## 2017-05-11 NOTE — ED Provider Notes (Signed)
[facility]-EMERGENCY DEPT Provider Note   CSN: [id number] Arrival date & time: 05/11/17  1334     History   Chief Complaint Chief Complaint  Patient presents with  . Chest Pain    HPI Michelle Roy is a 21 y.o. female.  Patient complains chest pain with inspiration for a number days.   The history is provided by the patient.  Chest Pain   This is a new problem. The current episode started more than 2 days ago. The problem occurs constantly. The problem has not changed since onset.Associated with: breathing. The pain is present in the substernal region. The pain is at a severity of 4/10. The pain is moderate. The quality of the pain is described as pleuritic. The pain does not radiate. Pertinent negatives include no abdominal pain, no back pain, no cough and no headaches.  Pertinent negatives for past medical history include no seizures.    History reviewed. No pertinent past medical history.  There are no active problems to display for this patient.   Past Surgical History:  Procedure Laterality Date  . ADENOIDECTOMY    . TONSILLECTOMY      OB History    Gravida Para Term Preterm AB Living   0 0 0 0 0 0   SAB TAB Ectopic Multiple Live Births   0 0 0 0 0       Home Medications    Prior to Admission medications   Medication Sig Start Date End Date Taking? Authorizing Provider  levonorgestrel (MIRENA) 20 MCG/24HR IUD 1 each by Intrauterine route once.   Yes [provider]  Phentermine-Topiramate (QSYMIA) 11.25-69 MG CP24 Take 1 tablet every other day by mouth.   Yes [provider]  ibuprofen (ADVIL,MOTRIN) 200 MG tablet Take 200 mg by mouth every 6 (six) hours as needed.    [provider]  methocarbamol (ROBAXIN) 500 MG tablet Take 1 tablet (500 mg total) by mouth at bedtime and may repeat dose one time if needed. Patient not taking: Reported on 05/11/2017 07/04/16   Bethel BornGekas, Kelly Marie, PA-C  naproxen (NAPROSYN)  500 MG tablet Take 1 tablet (500 mg total) 2 (two) times daily by mouth. 05/11/17   Bethann BerkshireZammit, Dontee Jaso, MD  traMADol (ULTRAM) 50 MG tablet Take 1 tablet (50 mg total) by mouth every 6 (six) hours as needed. Patient not taking: Reported on 05/11/2017 07/04/16   Bethel BornGekas, Kelly Marie, PA-C    Family History Family History  Problem Relation Age of Onset  . Polycystic ovary syndrome Paternal Aunt     Social History Social History   Tobacco Use  . Smoking status: Never Smoker  . Smokeless tobacco: Never Used  Substance Use Topics  . Alcohol use: No  . Drug use: No     Allergies   Penicillins   Review of Systems Review of Systems  Constitutional: Negative for appetite change and fatigue.  HENT: Negative for congestion, ear discharge and sinus pressure.   Eyes: Negative for discharge.  Respiratory: Negative for cough.   Cardiovascular: Positive for chest pain.  Gastrointestinal: Negative for abdominal pain and diarrhea.  Genitourinary: Negative for frequency and hematuria.  Musculoskeletal: Negative for back pain.  Skin: Negative for rash.  Neurological: Negative for seizures and headaches.  Psychiatric/Behavioral: Negative for hallucinations.     Physical Exam Updated Vital Signs BP 116/69 (BP Location: Right Arm)   Pulse 82   Temp 98.4 F (36.9 C) (Oral)   Resp 20   Ht 5'  5" (1.651 m)   Wt 81.6 kg (180 lb)   SpO2 100%   BMI 29.95 kg/m   Physical Exam  Constitutional: She is oriented to person, place, and time. She appears well-developed.  HENT:  Head: Normocephalic.  Eyes: Conjunctivae and EOM are normal. No scleral icterus.  Neck: Neck supple. No thyromegaly present.  Cardiovascular: Exam reveals no gallop and no friction rub.  No murmur heard. Tachycardia  Pulmonary/Chest: No stridor. She has no wheezes. She has no rales. She exhibits no tenderness.  Abdominal: She exhibits no distension. There is no tenderness. There is no rebound.  Musculoskeletal: Normal  range of motion. She exhibits no edema.  Lymphadenopathy:    She has no cervical adenopathy.  Neurological: She is oriented to person, place, and time. She exhibits normal muscle tone. Coordination normal.  Skin: No rash noted. No erythema.  Psychiatric: She has a normal mood and affect. Her behavior is normal.     ED Treatments / Results  Labs (all labs ordered are listed, but only abnormal results are displayed) Labs Reviewed  D-DIMER, QUANTITATIVE (NOT AT [facility]) - Abnormal; Notable for the following components:      Result Value   D-Dimer, Quant 2.77 (*)    All other components within normal limits  URINE CULTURE  BASIC METABOLIC PANEL  CBC  I-STAT TROPONIN, ED  I-STAT BETA HCG BLOOD, ED ([facility], [facility], [facility] ONLY)    EKG  EKG Interpretation  Date/Time:  Tuesday May 11 2017 13:46:31 EST Ventricular Rate:  84 PR Interval:    QRS Duration: 91 QT Interval:  367 QTC Calculation: 434 R Axis:   82 Text Interpretation:  Sinus rhythm Borderline T wave abnormalities Confirmed by Bethann Berkshire [phone]) on 05/11/2017 3:53:41 PM       Radiology Dg Chest 2 View  Result Date: 05/11/2017 CLINICAL DATA:  Central and left chest pain. EXAM: CHEST  2 VIEW COMPARISON:  05/07/2015. FINDINGS: Mediastinum and hilar structures are normal. Heart size normal. Lungs are clear. No pleural effusion or pneumothorax. No acute bony abnormality . IMPRESSION: No acute cardiopulmonary disease. Electronically Signed   By: Maisie Fus  Register   On: 05/11/2017 15:08   Ct Angio Chest Pe W And/or Wo Contrast  Result Date: 05/11/2017 CLINICAL DATA:  21 year old female with dyspnea. EXAM: CT ANGIOGRAPHY CHEST WITH CONTRAST TECHNIQUE: Multidetector CT imaging of the chest was performed using the standard protocol during bolus administration of intravenous contrast. Multiplanar CT image reconstructions and MIPs were obtained to evaluate the vascular anatomy. CONTRAST:  ISOVUE-370 IOPAMIDOL (ISOVUE-370) INJECTION  76% COMPARISON:  Chest radiograph dated 05/11/2017 FINDINGS: Cardiovascular: There is no cardiomegaly or pericardial effusion. The thoracic aorta is unremarkable. The origins of the great vessels of the aortic arch appear patent. Evaluation of the pulmonary arteries is limited due to respiratory motion artifact and suboptimal opacification of the peripheral branches. No large or central pulmonary artery embolus identified. Mediastinum/Nodes: There is no hilar or mediastinal adenopathy. Esophagus and the thyroid gland are grossly unremarkable. No mediastinal fluid collection. Lungs/Pleura: The lungs are clear. There is no pleural effusion or pneumothorax. The central airways are patent. Upper Abdomen: The visualized upper abdomen is unremarkable. Musculoskeletal: No chest wall abnormality. No acute or significant osseous findings. Review of the MIP images confirms the above findings. IMPRESSION: No acute intrathoracic pathology. No CT evidence of central pulmonary artery embolus. Electronically Signed   By: Elgie Collard M.D.   On: 05/11/2017 22:05    Procedures Procedures (including critical care time)  Medications Ordered in ED Medications  iopamidol (ISOVUE-370) 76 % injection (not administered)  morphine 4 MG/ML injection 4 mg (4 mg Intravenous Given 05/11/17 1626)  morphine 4 MG/ML injection 4 mg (4 mg Intravenous Given 05/11/17 1938)  iopamidol (ISOVUE-370) 76 % injection 100 mL (100 mLs Intravenous Contrast Given 05/11/17 2136)     Initial Impression / Assessment and Plan / ED Course  I have reviewed the triage vital signs and the nursing notes.  Pertinent labs & imaging results that were available during my care of the patient were reviewed by me and considered in my medical decision making (see chart for details).     Patient with pleuritic chest pain.  She had a CT angios that was negative.  Patient will be placed on Naprosyn and will follow up with PCP.  Final Clinical  Impressions(s) / ED Diagnoses   Final diagnoses:  Pleuritis    ED Discharge Orders        Ordered    naproxen (NAPROSYN) 500 MG tablet  2 times daily     05/11/17 2254       Bethann BerkshireZammit, Keiara Sneeringer, MD 05/11/17 2259

## 2017-05-11 NOTE — ED Triage Notes (Signed)
Patient c/o chest pains after increasing her weight loss medication last week from 11.2mg  to max dose of 15mg .  Patient reports intermittent times of left side chest pains with heart palpitations and resting HR of 110bpm then other times will have central chest pains and starting to taper back down.  Patient reports central chest pain today with lightheadedness and nausea that brought her to ED.

## 2017-05-13 LAB — URINE CULTURE: Culture: NO GROWTH

## 2018-09-15 IMAGING — CT CT ANGIO CHEST
2 of 6 series · 19 of 36 positions shown · IV contrast (iopamidol)
Comparison: Chest radiograph dated 05/11/2017

CLINICAL DATA: 21-year-old female with dyspnea.

EXAM:
CT ANGIOGRAPHY CHEST WITH CONTRAST
TECHNIQUE: Multidetector CT imaging of the chest was performed using the
standard protocol during bolus administration of intravenous
contrast. Multiplanar CT image reconstructions and MIPs were
obtained to evaluate the vascular anatomy.
CONTRAST:  100mL ACOLK4-WVM IOPAMIDOL (ACOLK4-WVM) INJECTION 76%

[Series 6: thins for pacs · axial · 0.66mm/px · z∈[-294,-64]mm · 18 of 256 slices shown]
[im 13/256  lung]
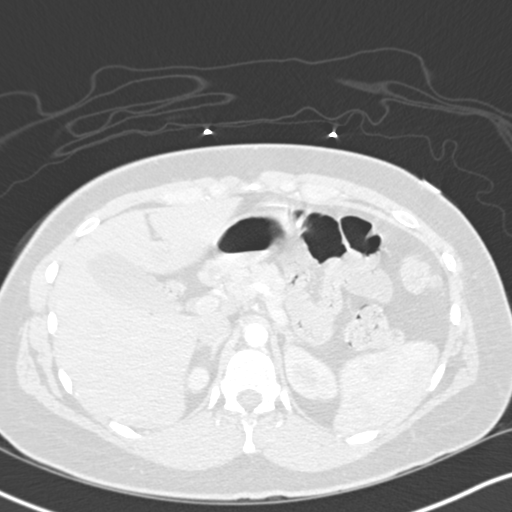
[im 26/256  mediastinal]
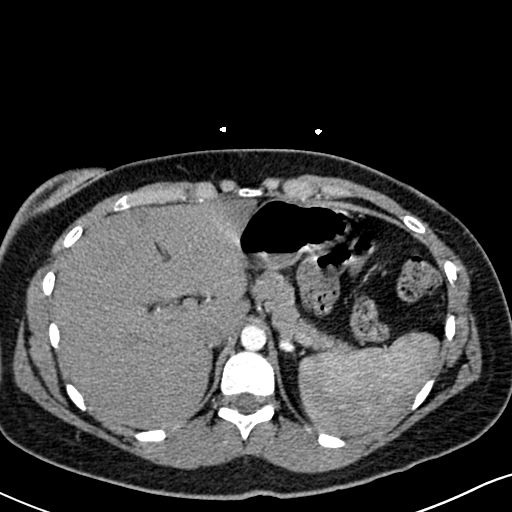
[im 39/256  lung]
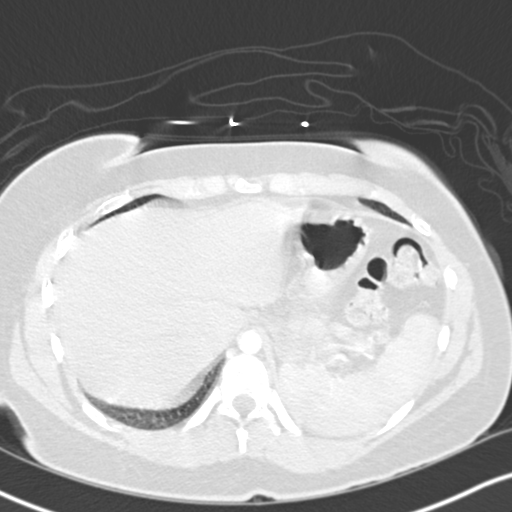
[im 52/256  mediastinal]
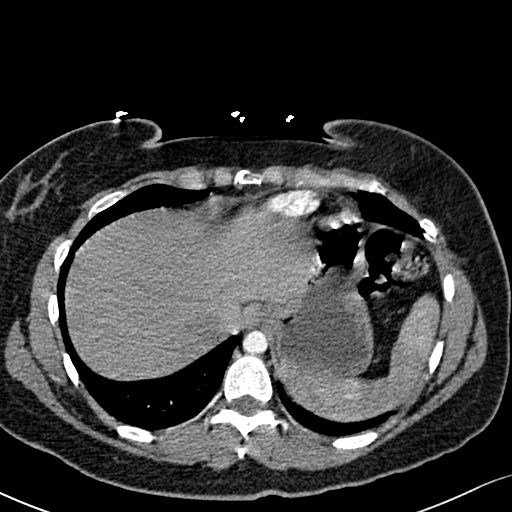
[im 64/256  lung]
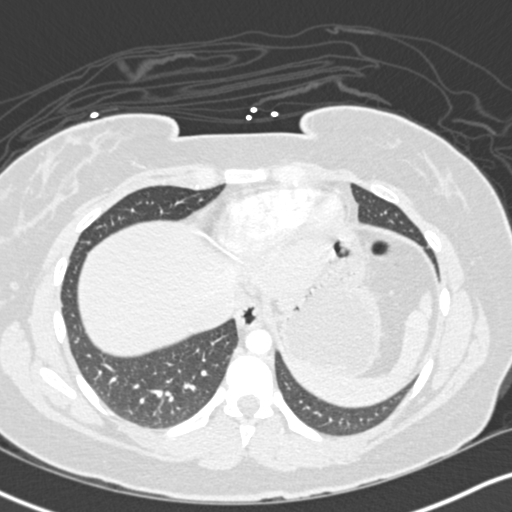
[im 77/256  mediastinal]
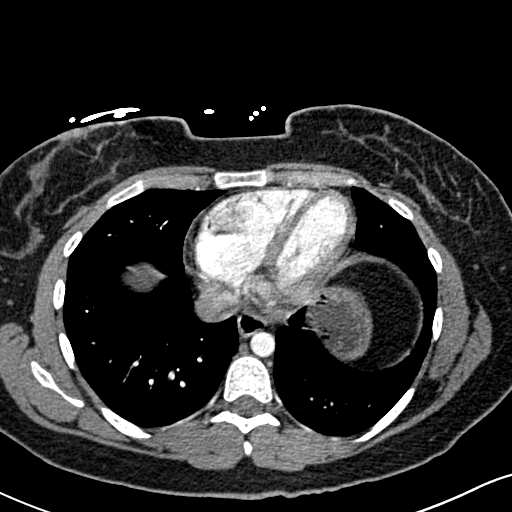
[im 90/256  lung]
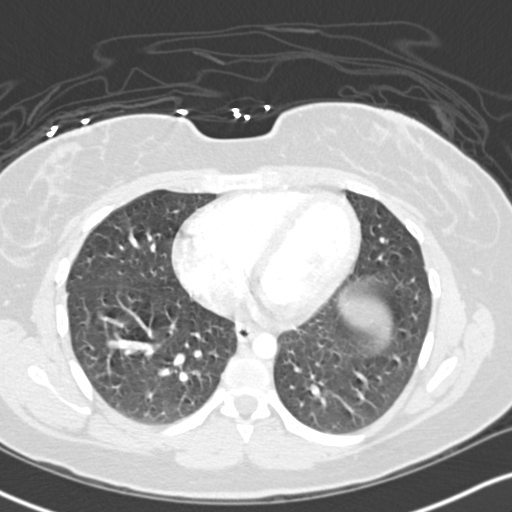
[im 103/256  mediastinal]
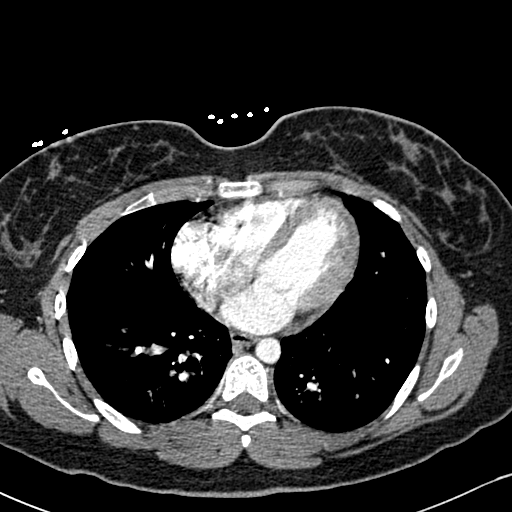
[im 115/256  lung]
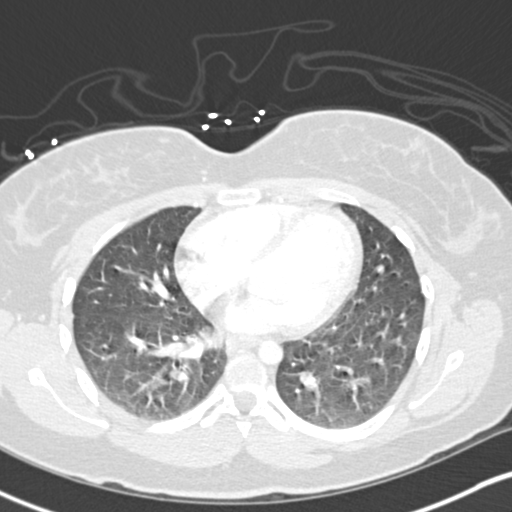
[im 141/256  mediastinal]
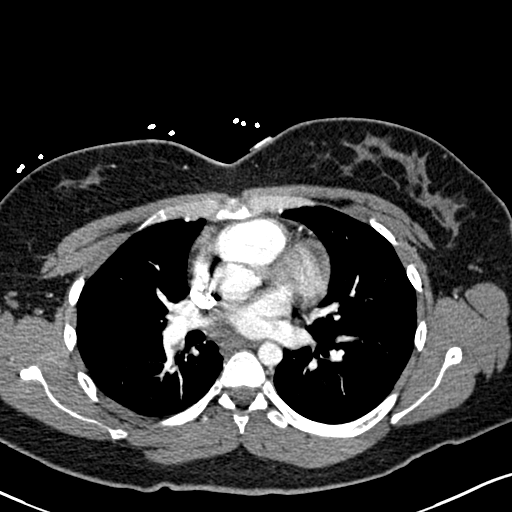
[im 154/256  lung]
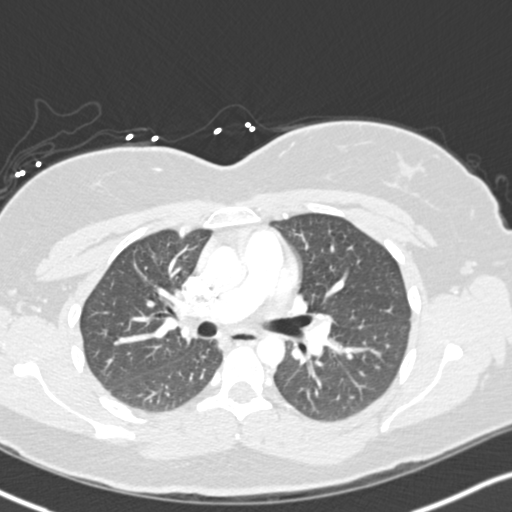
[im 166/256  mediastinal]
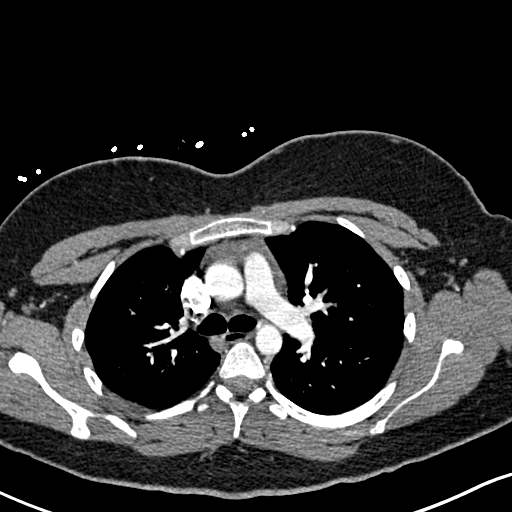
[im 179/256  lung]
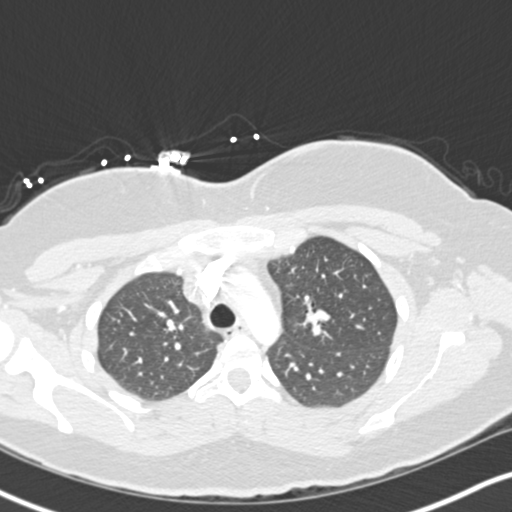
[im 192/256  mediastinal]
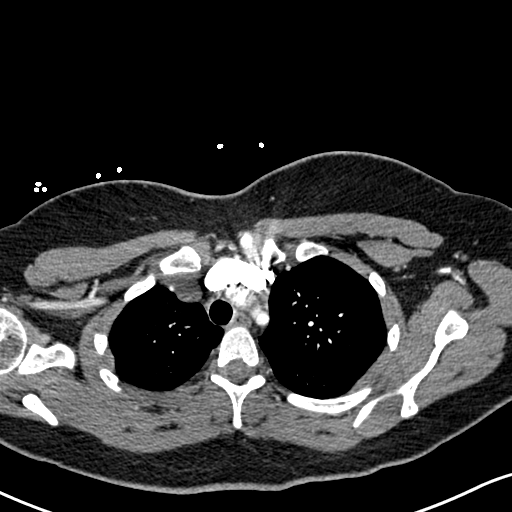
[im 205/256  lung]
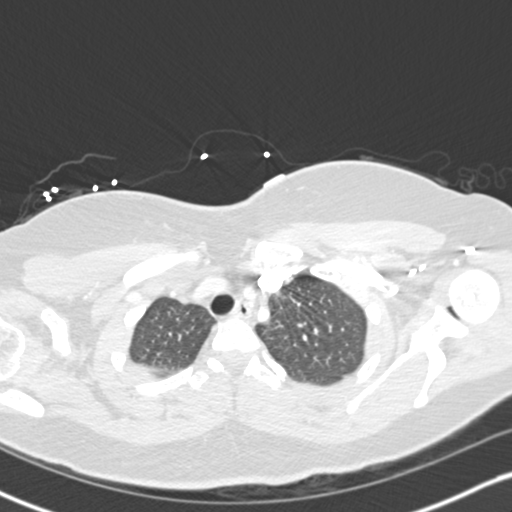
[im 217/256  mediastinal]
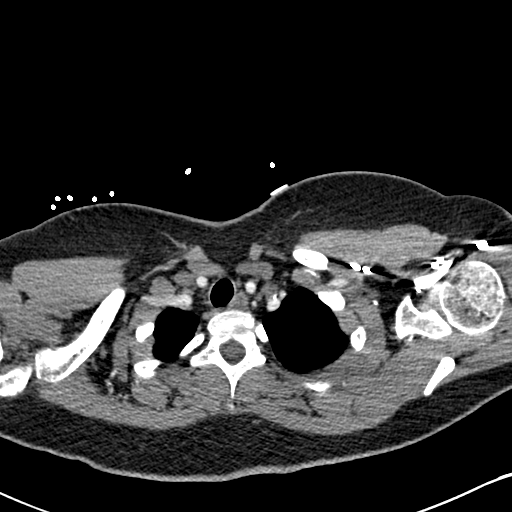
[im 230/256  lung]
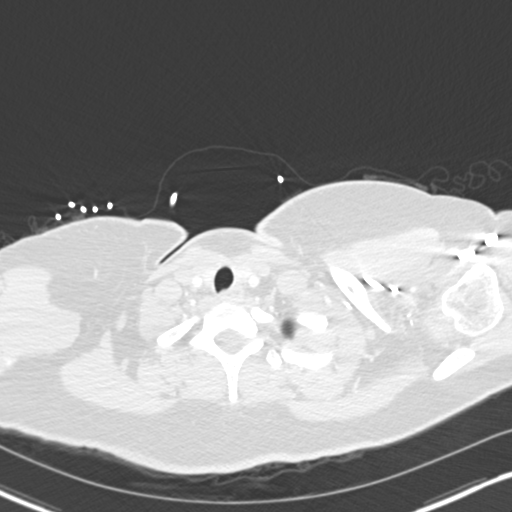
[im 243/256  mediastinal]
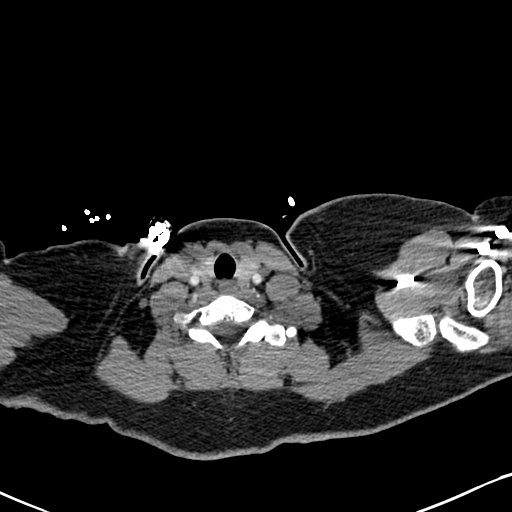

[Series 7: coronal mpr · coronal · 0.50mm/px · 1 of 116 slices shown]
[im 58/116  mediastinal]
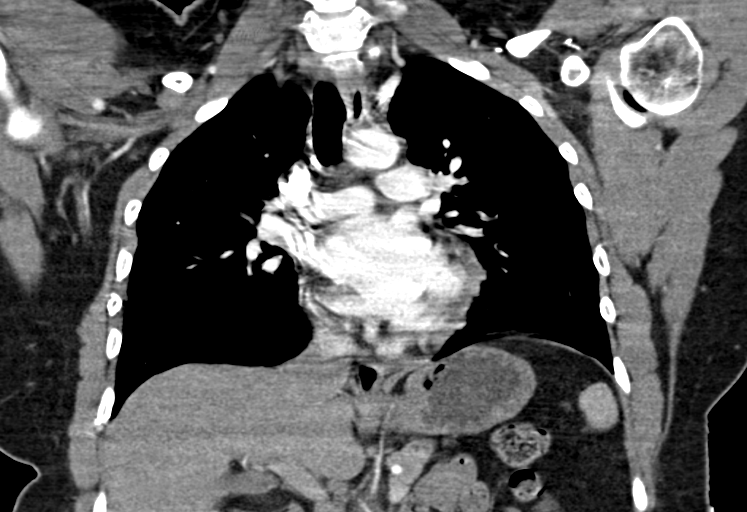

[19 of 36 positions shown; findings below may reference images not displayed]

FINDINGS: Cardiovascular: There is no cardiomegaly or pericardial effusion.
The thoracic aorta is unremarkable. The origins of the great vessels
of the aortic arch appear patent. Evaluation of the pulmonary
arteries is limited due to respiratory motion artifact and
suboptimal opacification of the peripheral branches. No large or
central pulmonary artery embolus identified.

Mediastinum/Nodes: There is no hilar or mediastinal adenopathy.
Esophagus and the thyroid gland are grossly unremarkable. No
mediastinal fluid collection.

Lungs/Pleura: The lungs are clear. There is no pleural effusion or
pneumothorax. The central airways are patent.

Upper Abdomen: The visualized upper abdomen is unremarkable.

Musculoskeletal: No chest wall abnormality. No acute or significant
osseous findings.

Review of the MIP images confirms the above findings.
IMPRESSION: No acute intrathoracic pathology. No CT evidence of central
pulmonary artery embolus.
